# Patient Record
Sex: Female | Born: 1975 | Race: White | Hispanic: No | Marital: Married | Smoking: Never smoker
Health system: Southern US, Community
[De-identification: ages and names within clinical notes are randomized; demographics above are authoritative.]

## PROBLEM LIST (undated history)

## (undated) DIAGNOSIS — N200 Calculus of kidney: Secondary | ICD-10-CM

## (undated) DIAGNOSIS — R011 Cardiac murmur, unspecified: Secondary | ICD-10-CM

## (undated) DIAGNOSIS — E785 Hyperlipidemia, unspecified: Secondary | ICD-10-CM

## (undated) DIAGNOSIS — I471 Supraventricular tachycardia: Secondary | ICD-10-CM

## (undated) DIAGNOSIS — J45909 Unspecified asthma, uncomplicated: Secondary | ICD-10-CM

## (undated) HISTORY — DX: Supraventricular tachycardia: I47.1

## (undated) HISTORY — DX: Hyperlipidemia, unspecified: E78.5

## (undated) HISTORY — DX: Cardiac murmur, unspecified: R01.1

## (undated) HISTORY — PX: ROOT CANAL: SHX2363

## (undated) HISTORY — DX: Calculus of kidney: N20.0

## (undated) HISTORY — DX: Unspecified asthma, uncomplicated: J45.909

---

## 2007-02-22 ENCOUNTER — Emergency Department: Payer: Self-pay | Admitting: Emergency Medicine

## 2007-03-29 ENCOUNTER — Ambulatory Visit: Payer: Self-pay

## 2007-08-11 ENCOUNTER — Observation Stay: Payer: Self-pay | Admitting: Unknown Physician Specialty

## 2007-08-12 ENCOUNTER — Inpatient Hospital Stay: Payer: Self-pay | Admitting: Unknown Physician Specialty

## 2009-01-21 ENCOUNTER — Other Ambulatory Visit: Payer: Self-pay | Admitting: Family Medicine

## 2009-02-13 ENCOUNTER — Ambulatory Visit: Payer: Self-pay | Admitting: Internal Medicine
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
## 2011-01-31 ENCOUNTER — Ambulatory Visit: Payer: Self-pay | Admitting: General Practice

## 2011-03-03 ENCOUNTER — Ambulatory Visit: Payer: Self-pay | Admitting: Internal Medicine

## 2011-03-04 ENCOUNTER — Ambulatory Visit: Payer: Self-pay | Admitting: Internal Medicine

## 2011-04-01 ENCOUNTER — Ambulatory Visit: Payer: Self-pay | Admitting: Orthopedic Surgery

## 2011-08-04 ENCOUNTER — Ambulatory Visit: Payer: Self-pay | Admitting: General Practice

## 2012-05-25 ENCOUNTER — Emergency Department: Payer: Self-pay | Admitting: Emergency Medicine

## 2012-05-25 LAB — CBC
HCT: 36.8 % (ref 35.0–47.0)
MCH: 31.5 pg (ref 26.0–34.0)
MCV: 90 fL (ref 80–100)
Platelet: 307 10*3/uL (ref 150–440)
RDW: 12.6 % (ref 11.5–14.5)
WBC: 6.4 10*3/uL (ref 3.6–11.0)

## 2012-05-25 LAB — URINALYSIS, COMPLETE
Bacteria: NONE SEEN
Bilirubin,UR: NEGATIVE
Glucose,UR: NEGATIVE mg/dL (ref 0–75)
Leukocyte Esterase: NEGATIVE
Ph: 6 (ref 4.5–8.0)
Protein: 30
RBC,UR: 254 /HPF (ref 0–5)
Specific Gravity: 1.025 (ref 1.003–1.030)
WBC UR: 12 /HPF (ref 0–5)

## 2012-05-25 LAB — COMPREHENSIVE METABOLIC PANEL
Alkaline Phosphatase: 81 U/L (ref 50–136)
Anion Gap: 10 (ref 7–16)
BUN: 13 mg/dL (ref 7–18)
Bilirubin,Total: 0.3 mg/dL (ref 0.2–1.0)
Calcium, Total: 8.4 mg/dL — ABNORMAL LOW (ref 8.5–10.1)
Chloride: 108 mmol/L — ABNORMAL HIGH (ref 98–107)
Creatinine: 0.98 mg/dL (ref 0.60–1.30)
EGFR (African American): >60
Glucose: 159 mg/dL — ABNORMAL HIGH (ref 65–99)
Potassium: 3.4 mmol/L — ABNORMAL LOW (ref 3.5–5.1)
SGOT(AST): 18 U/L (ref 15–37)
SGPT (ALT): 18 U/L (ref 12–78)
Sodium: 139 mmol/L (ref 136–145)
Total Protein: 6.9 g/dL (ref 6.4–8.2)

## 2012-09-05 ENCOUNTER — Ambulatory Visit: Payer: Self-pay | Admitting: Urology

## 2012-09-05 ENCOUNTER — Ambulatory Visit: Payer: Self-pay | Admitting: Physician Assistant

## 2012-09-05 LAB — CBC WITH DIFFERENTIAL/PLATELET
Basophil #: 0.1 10*3/uL (ref 0.0–0.1)
Eosinophil %: 3.5 %
HCT: 37.7 % (ref 35.0–47.0)
Lymphocyte %: 27.5 %
MCH: 31.4 pg (ref 26.0–34.0)
MCHC: 34.4 g/dL (ref 32.0–36.0)
MCV: 91 fL (ref 80–100)
Monocyte #: 0.4 x10 3/mm (ref 0.2–0.9)
Monocyte %: 7.4 %
Neutrophil %: 60.5 %
Platelet: 262 10*3/uL (ref 150–440)
RDW: 13.3 % (ref 11.5–14.5)
WBC: 5.2 10*3/uL (ref 3.6–11.0)

## 2012-09-05 LAB — COMPREHENSIVE METABOLIC PANEL
Albumin: 3.6 g/dL (ref 3.4–5.0)
Alkaline Phosphatase: 62 U/L (ref 50–136)
Anion Gap: 4 — ABNORMAL LOW (ref 7–16)
BUN: 13 mg/dL (ref 7–18)
Bilirubin,Total: 0.2 mg/dL (ref 0.2–1.0)
Co2: 28 mmol/L (ref 21–32)
Creatinine: 0.8 mg/dL (ref 0.60–1.30)
Osmolality: 274 (ref 275–301)
Potassium: 4 mmol/L (ref 3.5–5.1)
SGOT(AST): 12 U/L — ABNORMAL LOW (ref 15–37)
SGPT (ALT): 17 U/L (ref 12–78)

## 2012-09-05 LAB — TSH: Thyroid Stimulating Horm: 1.35 u[IU]/mL

## 2012-09-05 LAB — LIPID PANEL
HDL Cholesterol: 50 mg/dL (ref 40–60)
Ldl Cholesterol, Calc: 162 mg/dL — ABNORMAL HIGH (ref 0–100)
Triglycerides: 45 mg/dL (ref 0–200)
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
## 2013-09-20 ENCOUNTER — Other Ambulatory Visit: Payer: Self-pay | Admitting: Physician Assistant

## 2013-09-20 LAB — CBC WITH DIFFERENTIAL/PLATELET
BASOS PCT: 0.5 %
Basophil #: 0 10*3/uL (ref 0.0–0.1)
EOS ABS: 0.2 10*3/uL (ref 0.0–0.7)
Eosinophil %: 2.8 %
HCT: 36.9 % (ref 35.0–47.0)
HGB: 12.2 g/dL (ref 12.0–16.0)
LYMPHS PCT: 32.8 %
Lymphocyte #: 1.8 10*3/uL (ref 1.0–3.6)
MCH: 30.3 pg (ref 26.0–34.0)
MCHC: 33.1 g/dL (ref 32.0–36.0)
MCV: 92 fL (ref 80–100)
Monocyte #: 0.3 x10 3/mm (ref 0.2–0.9)
Monocyte %: 5 %
Neutrophil #: 3.3 10*3/uL (ref 1.4–6.5)
Neutrophil %: 58.9 %
Platelet: 265 10*3/uL (ref 150–440)
RBC: 4.03 10*6/uL (ref 3.80–5.20)
RDW: 13.1 % (ref 11.5–14.5)
WBC: 5.5 10*3/uL (ref 3.6–11.0)

## 2013-09-20 LAB — LIPID PANEL
Cholesterol: 232 mg/dL — ABNORMAL HIGH (ref 0–200)
HDL: 48 mg/dL (ref 40–60)
Ldl Cholesterol, Calc: 145 mg/dL — ABNORMAL HIGH (ref 0–100)
Triglycerides: 195 mg/dL (ref 0–200)
VLDL CHOLESTEROL, CALC: 39 mg/dL (ref 5–40)

## 2013-09-20 LAB — FERRITIN: Ferritin (ARMC): 35 ng/mL (ref 8–388)

## 2013-09-20 LAB — COMPREHENSIVE METABOLIC PANEL
ALBUMIN: 2.9 g/dL — AB (ref 3.4–5.0)
ALT: 24 U/L (ref 12–78)
ANION GAP: 8 (ref 7–16)
AST: 17 U/L (ref 15–37)
Alkaline Phosphatase: 52 U/L
BILIRUBIN TOTAL: 0.3 mg/dL (ref 0.2–1.0)
BUN: 10 mg/dL (ref 7–18)
CALCIUM: 8.9 mg/dL (ref 8.5–10.1)
Chloride: 103 mmol/L (ref 98–107)
Co2: 27 mmol/L (ref 21–32)
Creatinine: 0.79 mg/dL (ref 0.60–1.30)
EGFR (Non-African Amer.): >60
GLUCOSE: 84 mg/dL (ref 65–99)
Osmolality: 274 (ref 275–301)
Potassium: 3.7 mmol/L (ref 3.5–5.1)
Sodium: 138 mmol/L (ref 136–145)
Total Protein: 7 g/dL (ref 6.4–8.2)

## 2013-09-20 LAB — IRON AND TIBC
IRON BIND. CAP.(TOTAL): 427 ug/dL (ref 250–450)
IRON SATURATION: 17 %
Iron: 72 ug/dL (ref 50–170)
UNBOUND IRON-BIND. CAP.: 355 ug/dL

## 2013-09-20 LAB — MAGNESIUM: Magnesium: 1.9 mg/dL

## 2013-09-20 LAB — FOLATE: FOLIC ACID: 19.6 ng/mL (ref 3.1–100.0)

## 2013-09-20 LAB — TSH: THYROID STIMULATING HORM: 2.46 u[IU]/mL

## 2013-11-04 ENCOUNTER — Other Ambulatory Visit: Payer: Self-pay | Admitting: Obstetrics and Gynecology

## 2013-11-04 DIAGNOSIS — Z1231 Encounter for screening mammogram for malignant neoplasm of breast: Secondary | ICD-10-CM

## 2013-11-04 DIAGNOSIS — Z803 Family history of malignant neoplasm of breast: Secondary | ICD-10-CM

## 2013-11-12 ENCOUNTER — Ambulatory Visit: Payer: Self-pay

## 2013-12-18 ENCOUNTER — Telehealth: Payer: Self-pay | Admitting: *Deleted

## 2013-12-18 NOTE — Telephone Encounter (Signed)
Informed patient that per Dr. Fletcher Anon the [facility] event she emailed to me likely showed sinus tachycardia. SVT can not be ruled out  Patient verbalized understanding

## 2013-12-20 ENCOUNTER — Encounter: Payer: Self-pay | Admitting: Cardiovascular Disease

## 2013-12-20 ENCOUNTER — Ambulatory Visit (INDEPENDENT_AMBULATORY_CARE_PROVIDER_SITE_OTHER): Payer: 59 | Admitting: Cardiovascular Disease

## 2013-12-20 VITALS — BP 154/92 | HR 84 | Ht 63.0 in | Wt 144.2 lb

## 2013-12-20 VITALS — BP 173/87 | HR 92 | Ht 63.0 in | Wt 144.2 lb

## 2013-12-20 DIAGNOSIS — R0602 Shortness of breath: Secondary | ICD-10-CM

## 2013-12-20 DIAGNOSIS — R000 Tachycardia, unspecified: Secondary | ICD-10-CM

## 2013-12-20 NOTE — Patient Instructions (Signed)
Your physician has requested that you have an echocardiogram. Echocardiography is a painless test that uses sound waves to create images of your heart. It provides your doctor with information about the size and shape of your heart and how well your heart's chambers and valves are working. This procedure takes approximately one hour. There are no restrictions for this procedure.  Your physician has requested that you have an exercise tolerance test. For further information please visit [email or web address]. Please also follow instruction sheet, as given.

## 2013-12-20 NOTE — Patient Instructions (Signed)
We will call you with results

## 2013-12-20 NOTE — Procedures (Signed)
   Treadmill Stress test  Indication: Shortness of breath and tachycardia.  Baseline Data:  Resting EKG shows NSR with rate of 91 bpm, no significant ST or T wave changes. Resting blood pressure of 140/90 mm Hg Stand bruce protocal was used.  Exercise Data:  Patient exercised for 6 min 0 sec,  Peak heart rate of 162 bpm.  This was 89 % of the maximum predicted heart rate. No symptoms of chest pain or lightheadedness were reported at peak stress or in recovery.  Peak Blood pressure recorded was 165/63 Maximal work level: 7 METs.  Heart rate at 3 minutes in recovery was 120 bpm. BP response: Normal HR response: Accelerated  EKG with Exercise: Sinus tachycardia with no significant ST changes.  FINAL IMPRESSION: Normal exercise stress test. No significant EKG changes concerning for ischemia. Below average exercise tolerance with tachycardic response.   Recommendation: There is evidence of physical deconditioning. Asthma might be contributing. Proceed with an echocardiogram. Consider treatment with a calcium channel blocker.

## 2013-12-22 ENCOUNTER — Encounter: Payer: Self-pay | Admitting: Cardiovascular Disease

## 2013-12-22 DIAGNOSIS — R0602 Shortness of breath: Secondary | ICD-10-CM | POA: Insufficient documentation

## 2013-12-22 DIAGNOSIS — R000 Tachycardia, unspecified: Secondary | ICD-10-CM | POA: Insufficient documentation

## 2013-12-22 NOTE — Progress Notes (Signed)
HPI   Maria Delgado is a pleasant 38 year old female who is here for evaluation of shortness of breath and tachycardia. She reports having high resting heart rate with intermittent situational tachycardia. She had a tilt table test in the remote past. Past medical history includes migraines, asthma and kidney stones. Asthma seems to be moderate in severity. Family history is remarkable for hypertension as well as premature coronary artery disease. Her father had myocardial infarction at the age of 46. She reports elevated blood pressure but no hypertension. Over the last few weeks, she has noted exertional palpitations and tachycardia with limited activities even going up one flight of stairs. This has been associated with worsening shortness of breath but no chest discomfort. No orthopnea, PND or lower extremity edema. No syncope or presyncope. She checked her heart rate on multiple occasions and it was in the 120-150 range sometimes with not much physical activities.  Allergies  Allergen Reactions  . Ivp Dye [Iodinated Diagnostic Agents]   . Shellfish Allergy      No current outpatient prescriptions on file prior to visit.   No current facility-administered medications on file prior to visit.     Past Medical History  Diagnosis Date  . Kidney stones   . Hyperlipidemia   . Heart murmur   . Asthma   . SVT (supraventricular tachycardia)      Past Surgical History  Procedure Laterality Date  . Cesarean section    . Root canal       Family History  Problem Relation Age of Onset  . Heart attack Father   . Heart disease Father   . Heart failure Father      History   Social History  . Marital Status: Married    Spouse Name: N/A    Number of Children: N/A  . Years of Education: N/A   Occupational History  . Not on file.   Social History Main Topics  . Smoking status: Never Smoker   . Smokeless tobacco: Not on file  . Alcohol Use: No  . Drug Use: No  . Sexual Activity:  Not on file   Other Topics Concern  . Not on file   Social History Narrative  . No narrative on file     ROS A 10 point review of system was performed. It is negative other than that mentioned in the history of present illness.    PHYSICAL EXAM   BP 154/92  Pulse 84  Ht 5' 3"  (1.6 m)  Wt 144 lb 4 oz (65.431 kg)  BMI 25.56 kg/m2 Constitutional: She is oriented to person, place, and time. She appears well-developed and well-nourished. No distress.  HENT: No nasal discharge.  Head: Normocephalic and atraumatic.  Eyes: Pupils are equal and round. No discharge.  Neck: Normal range of motion. Neck supple. No JVD present. No thyromegaly present.  Cardiovascular: Normal rate, regular rhythm, normal heart sounds. Exam reveals no gallop and no friction rub. No murmur heard.  Pulmonary/Chest: Effort normal and breath sounds normal. No stridor. No respiratory distress. She has no wheezes. She has no rales. She exhibits no tenderness.  Abdominal: Soft. Bowel sounds are normal. She exhibits no distension. There is no tenderness. There is no rebound and no guarding.  Musculoskeletal: Normal range of motion. She exhibits no edema and no tenderness.  Neurological: She is alert and oriented to person, place, and time. Coordination normal.  Skin: Skin is warm and dry. No rash noted. She is not diaphoretic. No  erythema. No pallor.  Psychiatric: She has a normal mood and affect. Her behavior is normal. Judgment and thought content normal.     SKA:JGOTL  Rhythm  WITHIN NORMAL LIMITS   ASSESSMENT AND PLAN

## 2013-12-22 NOTE — Assessment & Plan Note (Signed)
I requested a treadmill stress test for evaluation as well as an echocardiogram.

## 2013-12-22 NOTE — Assessment & Plan Note (Signed)
It appears that she has been having intermittent sinus tachycardia with minimal activities. I suspect that this is likely multifactorial due to asthma and possible element of physical deconditioning. We also need to exclude structural cardiac abnormalities or pulmonary hypertension. I might consider treatment with a calcium channel blocker especially that she has elevated blood pressure.

## 2013-12-26 ENCOUNTER — Other Ambulatory Visit: Payer: Self-pay

## 2013-12-26 ENCOUNTER — Other Ambulatory Visit (INDEPENDENT_AMBULATORY_CARE_PROVIDER_SITE_OTHER): Payer: 59

## 2013-12-26 DIAGNOSIS — R000 Tachycardia, unspecified: Secondary | ICD-10-CM

## 2013-12-26 DIAGNOSIS — R0602 Shortness of breath: Secondary | ICD-10-CM

## 2013-12-27 ENCOUNTER — Telehealth: Payer: Self-pay | Admitting: *Deleted

## 2013-12-27 MED ORDER — DILTIAZEM HCL ER COATED BEADS 120 MG PO CP24: 120.0000 mg | ORAL_CAPSULE | Freq: Every day | ORAL | Status: DC

## 2013-12-27 NOTE — Telephone Encounter (Signed)
Message copied by Tracie Harrier on Fri Dec 27, 2013  5:56 PM ------      Message from: Kathlyn Sacramento A      Created: Fri Dec 27, 2013  5:40 PM       Start Diltiazem ER 120 mg once daily. Start a cardio exercise program. Target heart rate 140-160. ------

## 2014-01-06 ENCOUNTER — Encounter: Payer: Self-pay | Admitting: Cardiovascular Disease

## 2014-01-07 MED ORDER — DILTIAZEM HCL ER COATED BEADS 240 MG PO CP24: 240.0000 mg | ORAL_CAPSULE | Freq: Every day | ORAL | Status: DC

## 2014-01-07 NOTE — Telephone Encounter (Signed)
Dr. Fletcher Anon response:  I suggest that we increase Diltiazem ER to 240 mg once daily if she is not having any side effects with the current dose.   Called patient to inform her of Dr. Jacklynn Ganong response  LVM

## 2014-01-07 NOTE — Telephone Encounter (Signed)
Discussed Dr. Jacklynn Ganong recommendation with patient  Patient verbalized understanding

## 2014-04-30 ENCOUNTER — Ambulatory Visit: Payer: Self-pay | Admitting: Obstetrics and Gynecology

## 2014-07-21 ENCOUNTER — Telehealth: Payer: Self-pay

## 2014-07-21 NOTE — Telephone Encounter (Signed)
LVM 2/29

## 2014-07-21 NOTE — Telephone Encounter (Signed)
The swelling might be related to diltiazem. I suggest that we decrease the dose to 120 mg once daily. That's try small dose Toprol 25 mg once daily. Monitor asthma symptoms as beta blockers might worsen asthma. If she tolerates Toprol fine I might gradually increase the dose and stop diltiazem.

## 2014-07-21 NOTE — Telephone Encounter (Signed)
Pt states her HR is still elevated, sitting, 80's, 140 with activity. States she also has some swelling. States swelling is in ankles and legs. Please call. Pt has appt on 3/8

## 2014-07-29 ENCOUNTER — Encounter: Payer: Self-pay | Admitting: Cardiovascular Disease

## 2014-07-29 ENCOUNTER — Ambulatory Visit (INDEPENDENT_AMBULATORY_CARE_PROVIDER_SITE_OTHER): Payer: 59 | Admitting: Cardiovascular Disease

## 2014-07-29 VITALS — BP 132/82 | HR 89 | Ht 62.5 in | Wt 147.0 lb

## 2014-07-29 DIAGNOSIS — I951 Orthostatic hypotension: Secondary | ICD-10-CM

## 2014-07-29 DIAGNOSIS — G90A Postural orthostatic tachycardia syndrome (POTS): Secondary | ICD-10-CM

## 2014-07-29 DIAGNOSIS — R000 Tachycardia, unspecified: Secondary | ICD-10-CM

## 2014-07-29 MED ORDER — METOPROLOL SUCCINATE ER 25 MG PO TB24: 25.0000 mg | ORAL_TABLET | Freq: Every day | ORAL | Status: DC

## 2014-07-29 MED ORDER — DILTIAZEM HCL ER COATED BEADS 120 MG PO CP24: 120.0000 mg | ORAL_CAPSULE | Freq: Every day | ORAL | Status: DC

## 2014-07-29 NOTE — Patient Instructions (Signed)
Your physician has recommended you make the following change in your medication:  Decrease Diltiazem to 120 mg  Metoprolol 25 mg daily   Your physician recommends that you schedule a follow-up appointment in:  1 month

## 2014-07-29 NOTE — Telephone Encounter (Signed)
Patient scheduled to see Dr. Fletcher Anon today

## 2014-07-29 NOTE — Progress Notes (Signed)
HPI   Maria Delgado is a pleasant 39 year old female who is here for a follow-up visit regarding tachycardia. She reports having high resting heart rate with intermittent situational tachycardia. She had a tilt table test in the remote past. Past medical history includes migraines, asthma and kidney stones. Asthma seems to be mild to moderate in severity. Family history is remarkable for hypertension as well as premature coronary artery disease. Her father had myocardial infarction at the age of 48. Her heart rate is frequently above 130 bpm even with mild activities and she is usually symptomatic. Thus, I started her on diltiazem during last visit with some improvement in symptoms. However, she still complains of significant positional tachycardia which gives her uncomfortable feeling. She also noted some leg edema recently.  Allergies  Allergen Reactions  . Ivp Dye [Iodinated Diagnostic Agents]   . Shellfish Allergy      Current Outpatient Prescriptions on File Prior to Visit  Medication Sig Dispense Refill  . ALBUTEROL SULFATE PO Take by mouth as needed.    . clindamycin (CLEOCIN) 300 MG capsule Take 300 mg by mouth as needed.    Marland Kitchen dexlansoprazole (DEXILANT) 60 MG capsule Take 60 mg by mouth daily.    Marland Kitchen diltiazem (CARDIZEM CD) 240 MG 24 hr capsule Take 1 capsule (240 mg total) by mouth daily. 90 capsule 3  . eletriptan (RELPAX) 40 MG tablet Take 40 mg by mouth daily. One tablet by mouth at onset of headache. May repeat in 2 hours if headache persists or recurs.    . fluticasone (FLONASE) 50 MCG/ACT nasal spray Place 2 sprays into both nostrils daily.    . Fluticasone-Salmeterol (ADVAIR HFA IN) Inhale into the lungs 2 (two) times daily.    Marland Kitchen HYDROcodone-acetaminophen (NORCO/VICODIN) 5-325 MG per tablet Take 1 tablet by mouth every 6 (six) hours as needed for moderate pain.    Marland Kitchen KRILL OIL PO Take by mouth daily.    Marland Kitchen loratadine (CLARITIN) 10 MG tablet Take 10 mg by mouth daily.    . Magnesium  250 MG TABS Take by mouth daily.    . montelukast (SINGULAIR) 10 MG tablet Take 10 mg by mouth at bedtime.    . norethindrone-ethinyl estradiol (JUNEL FE,GILDESS FE,LOESTRIN FE) 1-20 MG-MCG tablet Take 1 tablet by mouth daily.    . ondansetron (ZOFRAN-ODT) 4 MG disintegrating tablet Take 4 mg by mouth as needed for nausea or vomiting.    . solifenacin (VESICARE) 5 MG tablet Take 5 mg by mouth daily.    . SUMAtriptan (IMITREX) 50 MG tablet Take 50 mg by mouth as needed for migraine or headache. May repeat in 2 hours if headache persists or recurs.    . tamsulosin (FLOMAX) 0.4 MG CAPS capsule Take 0.4 mg by mouth as needed.    . zolpidem (AMBIEN) 10 MG tablet Take 10 mg by mouth at bedtime.     No current facility-administered medications on file prior to visit.     Past Medical History  Diagnosis Date  . Kidney stones   . Hyperlipidemia   . Heart murmur   . Asthma   . SVT (supraventricular tachycardia)      Past Surgical History  Procedure Laterality Date  . Cesarean section    . Root canal       Family History  Problem Relation Age of Onset  . Heart attack Father   . Heart disease Father   . Heart failure Father      History  Social History  . Marital Status: Married    Spouse Name: N/A  . Number of Children: N/A  . Years of Education: N/A   Occupational History  . Not on file.   Social History Main Topics  . Smoking status: Never Smoker   . Smokeless tobacco: Not on file  . Alcohol Use: No  . Drug Use: No  . Sexual Activity: Not on file   Other Topics Concern  . Not on file   Social History Narrative     ROS A 10 point review of system was performed. It is negative other than that mentioned in the history of present illness.    PHYSICAL EXAM   BP 132/82 mmHg  Pulse 89  Ht 5' 2.5" (1.588 m)  Wt 147 lb (66.679 kg)  BMI 26.44 kg/m2 Constitutional: She is oriented to person, place, and time. She appears well-developed and well-nourished. No  distress.  HENT: No nasal discharge.  Head: Normocephalic and atraumatic.  Eyes: Pupils are equal and round. No discharge.  Neck: Normal range of motion. Neck supple. No JVD present. No thyromegaly present.  Cardiovascular: Normal rate, regular rhythm, normal heart sounds. Exam reveals no gallop and no friction rub. No murmur heard.  Pulmonary/Chest: Effort normal and breath sounds normal. No stridor. No respiratory distress. She has no wheezes. She has no rales. She exhibits no tenderness.  Abdominal: Soft. Bowel sounds are normal. She exhibits no distension. There is no tenderness. There is no rebound and no guarding.  Musculoskeletal: Normal range of motion. She exhibits no edema and no tenderness.  Neurological: She is alert and oriented to person, place, and time. Coordination normal.  Skin: Skin is warm and dry. No rash noted. She is not diaphoretic. No erythema. No pallor.  Psychiatric: She has a normal mood and affect. Her behavior is normal. Judgment and thought content normal.     JSE:GBTDV  Rhythm  WITHIN NORMAL LIMITS   ASSESSMENT AND PLAN

## 2014-08-03 ENCOUNTER — Encounter: Payer: Self-pay | Admitting: Cardiovascular Disease

## 2014-08-03 DIAGNOSIS — R000 Tachycardia, unspecified: Secondary | ICD-10-CM

## 2014-08-03 DIAGNOSIS — G90A Postural orthostatic tachycardia syndrome (POTS): Secondary | ICD-10-CM | POA: Insufficient documentation

## 2014-08-03 DIAGNOSIS — I951 Orthostatic hypotension: Secondary | ICD-10-CM

## 2014-08-03 NOTE — Assessment & Plan Note (Signed)
Heart rate increased from 77 bpm in the lying position to 98 bpm in the standing position. This is consistent with her symptoms. 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 We discussed today the importance of increased fluid intake and avoiding sudden change in position. She might benefit from a beta blocker more than a calcium channel blocker but I was hesitant initially to do that given her history of asthma which has been mild in the recent 6-12 months according to the patient. Also the leg edema is likely due to diltiazem. Due to that, I decided to start small dose Toprol and The dose of diltiazem by half. If she tolerates Toprol with good response, I might elect to discontinue diltiazem altogether. I will consider referral to electrophysiology if there is no improvement in symptoms. We provided her today with information about appropriate lifestyle changes.

## 2014-09-12 ENCOUNTER — Ambulatory Visit (INDEPENDENT_AMBULATORY_CARE_PROVIDER_SITE_OTHER): Payer: 59 | Admitting: Cardiovascular Disease

## 2014-09-12 ENCOUNTER — Encounter: Payer: Self-pay | Admitting: Cardiovascular Disease

## 2014-09-12 VITALS — BP 116/70 | HR 79 | Ht 62.0 in | Wt 149.0 lb

## 2014-09-12 DIAGNOSIS — I951 Orthostatic hypotension: Secondary | ICD-10-CM | POA: Diagnosis not present

## 2014-09-12 DIAGNOSIS — R000 Tachycardia, unspecified: Secondary | ICD-10-CM | POA: Diagnosis not present

## 2014-09-12 DIAGNOSIS — G90A Postural orthostatic tachycardia syndrome (POTS): Secondary | ICD-10-CM

## 2014-09-12 MED ORDER — METOPROLOL SUCCINATE ER 50 MG PO TB24: 50.0000 mg | ORAL_TABLET | Freq: Every day | ORAL | Status: AC

## 2014-09-12 NOTE — Progress Notes (Signed)
HPI   Maria Delgado is a pleasant 39 year old female who is here for a follow-up visit regarding tachycardia and POTS. She reports having high resting heart rate with intermittent situational tachycardia. She had a tilt table test in the remote past. Past medical history includes migraines, asthma and kidney stones. Asthma seems to be mild to moderate in severity. Family history is remarkable for hypertension as well as premature coronary artery disease. Her father had myocardial infarction at the age of 95. Her heart rate is frequently above 130 bpm even with mild activities and she is usually symptomatic. Thus, I started her on diltiazem during with some improvement in symptoms. She developed leg edema. During last visit, I decreased diltiazem by half to 120 mg once daily and added small dose Toprol 25 mg once daily. She also increased salt and fluid intake. She reports improved symptoms overall. She will be moving to [facility] next month .  Allergies  Allergen Reactions  . Ivp Dye [Iodinated Diagnostic Agents]   . Shellfish Allergy      Current Outpatient Prescriptions on File Prior to Visit  Medication Sig Dispense Refill  . ALBUTEROL SULFATE PO Take by mouth as needed.    Marland Kitchen dexlansoprazole (DEXILANT) 60 MG capsule Take 60 mg by mouth daily.    Marland Kitchen eletriptan (RELPAX) 40 MG tablet Take 40 mg by mouth daily. One tablet by mouth at onset of headache. May repeat in 2 hours if headache persists or recurs.    . Fluticasone-Salmeterol (ADVAIR HFA IN) Inhale into the lungs 2 (two) times daily.    Marland Kitchen HYDROcodone-acetaminophen (NORCO/VICODIN) 5-325 MG per tablet Take 1 tablet by mouth every 6 (six) hours as needed for moderate pain (for kidney stones).     . loratadine (CLARITIN) 10 MG tablet Take 10 mg by mouth daily.    . Magnesium 250 MG TABS Take by mouth daily.    . montelukast (SINGULAIR) 10 MG tablet Take 10 mg by mouth at bedtime.    . norethindrone-ethinyl estradiol (JUNEL FE,GILDESS FE,LOESTRIN FE)  1-20 MG-MCG tablet Take 1 tablet by mouth daily.    . ondansetron (ZOFRAN-ODT) 4 MG disintegrating tablet Take 4 mg by mouth as needed for nausea or vomiting (forn kidney stones).     . SUMAtriptan (IMITREX) 50 MG tablet Take 50 mg by mouth as needed for migraine or headache. May repeat in 2 hours if headache persists or recurs.    . tamsulosin (FLOMAX) 0.4 MG CAPS capsule Take 0.4 mg by mouth as needed (for kidney stones/pain).     Marland Kitchen zolpidem (AMBIEN) 10 MG tablet Take 10 mg by mouth at bedtime.     No current facility-administered medications on file prior to visit.     Past Medical History  Diagnosis Date  . Kidney stones   . Hyperlipidemia   . Heart murmur   . Asthma   . SVT (supraventricular tachycardia)      Past Surgical History  Procedure Laterality Date  . Cesarean section    . Root canal       Family History  Problem Relation Age of Onset  . Heart attack Father   . Heart disease Father   . Heart failure Father      History   Social History  . Marital Status: Married    Spouse Name: N/A  . Number of Children: N/A  . Years of Education: N/A   Occupational History  . Not on file.   Social History Main Topics  .  Smoking status: Never Smoker   . Smokeless tobacco: Not on file  . Alcohol Use: No  . Drug Use: No  . Sexual Activity: Not on file   Other Topics Concern  . Not on file   Social History Narrative     ROS A 10 point review of system was performed. It is negative other than that mentioned in the history of present illness.    PHYSICAL EXAM   BP 116/70 mmHg  Pulse 79  Ht 5' 2"  (1.575 m)  Wt 149 lb (67.586 kg)  BMI 27.25 kg/m2 Constitutional: She is oriented to person, place, and time. She appears well-developed and well-nourished. No distress.  HENT: No nasal discharge.  Head: Normocephalic and atraumatic.  Eyes: Pupils are equal and round. No discharge.  Neck: Normal range of motion. Neck supple. No JVD present. No thyromegaly  present.  Cardiovascular: Normal rate, regular rhythm, normal heart sounds. Exam reveals no gallop and no friction rub. No murmur heard.  Pulmonary/Chest: Effort normal and breath sounds normal. No stridor. No respiratory distress. She has no wheezes. She has no rales. She exhibits no tenderness.  Abdominal: Soft. Bowel sounds are normal. She exhibits no distension. There is no tenderness. There is no rebound and no guarding.  Musculoskeletal: Normal range of motion. She exhibits no edema and no tenderness.  Neurological: She is alert and oriented to person, place, and time. Coordination normal.  Skin: Skin is warm and dry. No rash noted. She is not diaphoretic. No erythema. No pallor.  Psychiatric: She has a normal mood and affect. Her behavior is normal. Judgment and thought content normal.     QUI:QNVVY  Rhythm  WITHIN NORMAL LIMITS   ASSESSMENT AND PLAN

## 2014-09-12 NOTE — Assessment & Plan Note (Signed)
She tolerated small dose Toprol with improved symptoms. Thus, I discontinued diltiazem and increase Toprol to 50 mg once daily. Continue with increased fluid and sodium intake.

## 2014-09-12 NOTE — Patient Instructions (Addendum)
Stop Diltiazem 120 mg.  Increase Metoprolol Succinate 50 mg Take 1 tablet by mouth daily.   Follow up with Dr. Fletcher Anon as needed.

## 2021-02-21 LAB — EXTERNAL GENERIC LAB PROCEDURE: COLOGUARD: NEGATIVE
